# Patient Record
Sex: Male | Born: 2000
Health system: Southern US, Community
[De-identification: ages and names within clinical notes are randomized; demographics above are authoritative.]

## PROBLEM LIST (undated history)

## (undated) DIAGNOSIS — F419 Anxiety disorder, unspecified: Secondary | ICD-10-CM

## (undated) DIAGNOSIS — F32A Depression, unspecified: Secondary | ICD-10-CM

---
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
## 2022-08-03 IMAGING — DX DG CHEST 1V PORT
1 series · 1 of 1 positions shown · non-contrast
Comparison: None

CLINICAL DATA: COVID.  Shortness of breath.

EXAM:
PORTABLE CHEST 1 VIEW

[chest]
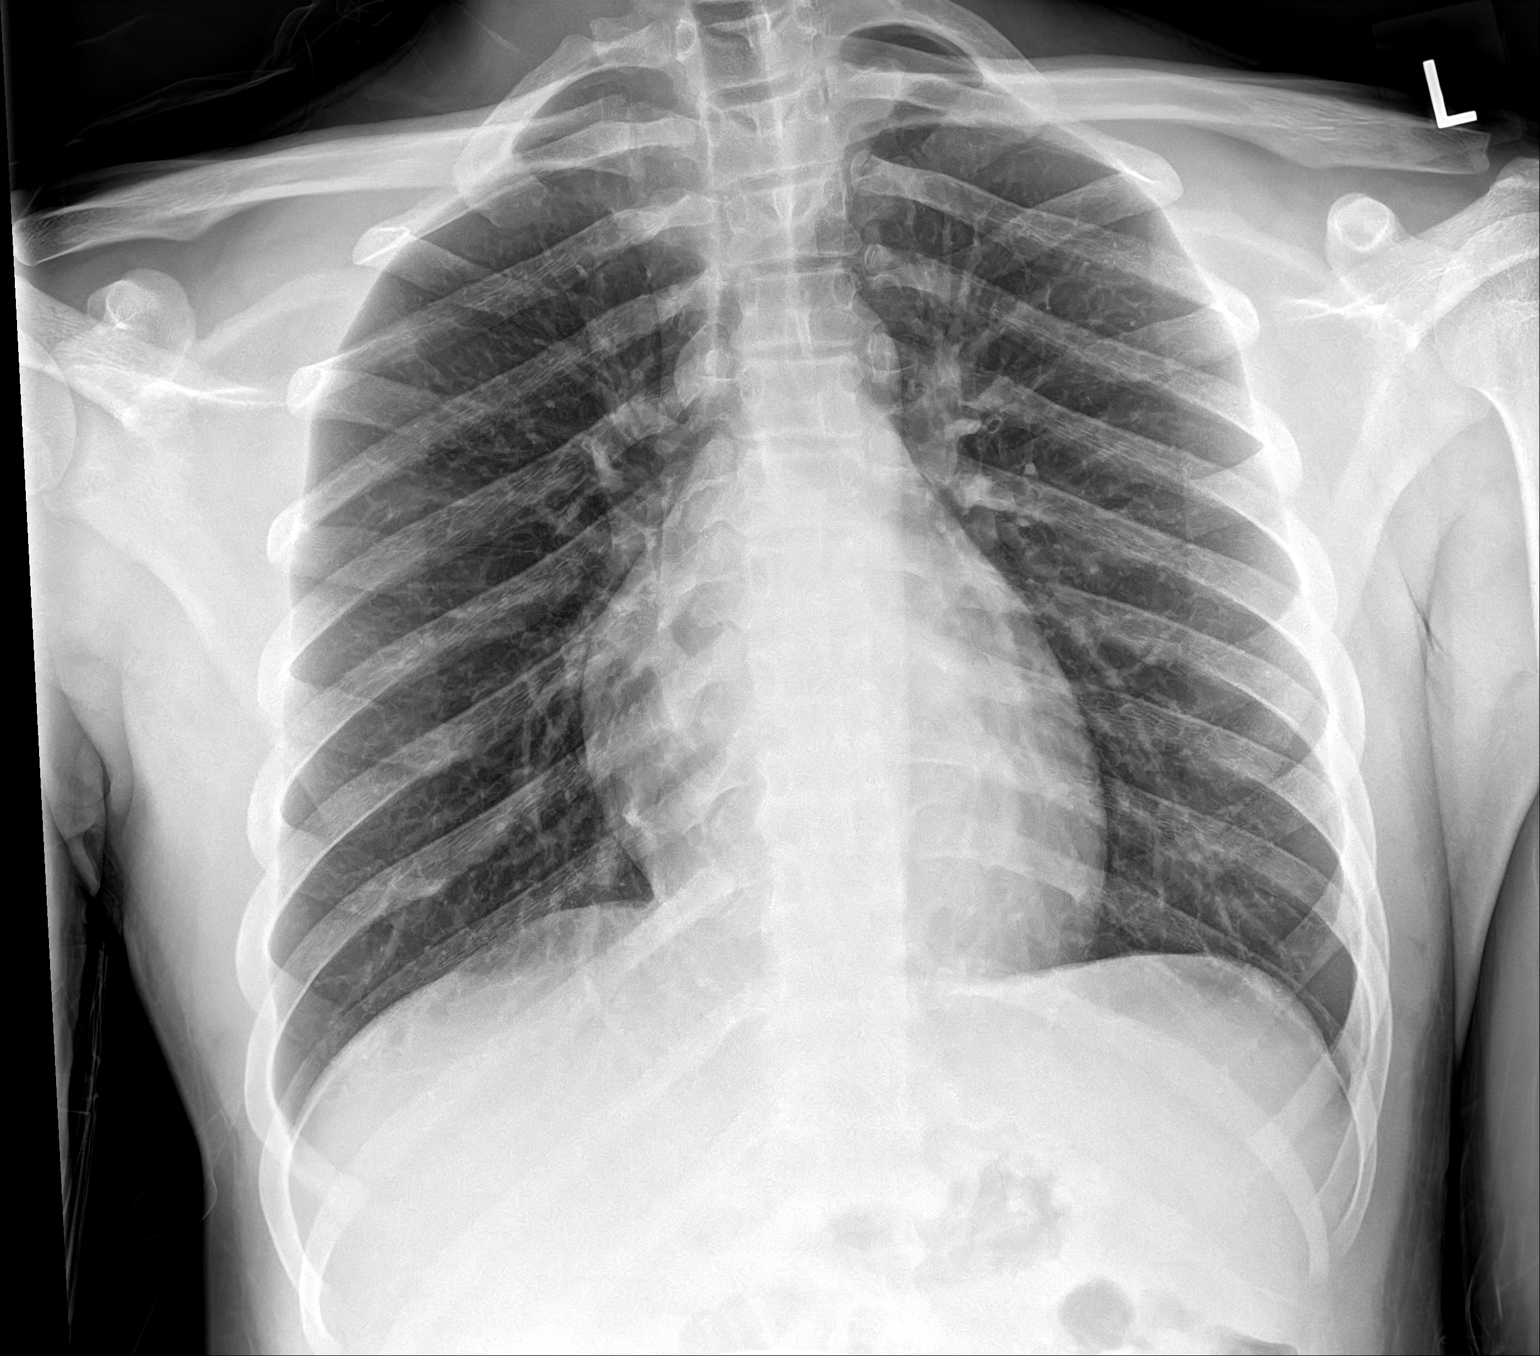

[1 of 1 positions shown; findings below may reference images not displayed]

FINDINGS: The heart size and mediastinal contours are within normal limits.
Both lungs are clear. The visualized skeletal structures are
unremarkable.
IMPRESSION: No active disease.

## 2022-08-28 DIAGNOSIS — K602 Anal fissure, unspecified: Secondary | ICD-10-CM | POA: Diagnosis not present
